# Patient Record
Sex: Female | Born: 1995
Health system: Southern US, Community
[De-identification: ages and names within clinical notes are randomized; demographics above are authoritative.]

---

## 2017-12-26 ENCOUNTER — Emergency Department (HOSPITAL_COMMUNITY): Payer: BLUE CROSS/BLUE SHIELD

## 2017-12-26 ENCOUNTER — Emergency Department (HOSPITAL_COMMUNITY)
Admission: EM | Admit: 2017-12-26 | Discharge: 2017-12-26 | Disposition: A | Discharge status: Home / Self Care | Payer: BLUE CROSS/BLUE SHIELD | Attending: Emergency Medicine | Admitting: Emergency Medicine

## 2017-12-26 ENCOUNTER — Other Ambulatory Visit: Payer: Self-pay

## 2017-12-26 DIAGNOSIS — S39012A Strain of muscle, fascia and tendon of lower back, initial encounter: Secondary | ICD-10-CM | POA: Diagnosis not present

## 2017-12-26 DIAGNOSIS — Y92481 Parking lot as the place of occurrence of the external cause: Secondary | ICD-10-CM | POA: Diagnosis not present

## 2017-12-26 DIAGNOSIS — S300XXA Contusion of lower back and pelvis, initial encounter: Secondary | ICD-10-CM | POA: Diagnosis not present

## 2017-12-26 DIAGNOSIS — Y999 Unspecified external cause status: Secondary | ICD-10-CM | POA: Insufficient documentation

## 2017-12-26 DIAGNOSIS — Y939 Activity, unspecified: Secondary | ICD-10-CM | POA: Insufficient documentation

## 2017-12-26 DIAGNOSIS — S3992XA Unspecified injury of lower back, initial encounter: Secondary | ICD-10-CM | POA: Diagnosis present

## 2017-12-26 DIAGNOSIS — S80212A Abrasion, left knee, initial encounter: Secondary | ICD-10-CM

## 2017-12-26 LAB — POC URINE PREG, ED: Preg Test, Ur: NEGATIVE

## 2017-12-26 NOTE — Discharge Instructions (Addendum)
Apply ice for thirty minutes, four times a day.  Take ibuprofen or naproxen as needed for pain.  Take acetaminophenIf pain for additional pain relief if pain is not being adequately controlled with ibuprofen or naproxen.
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
## 2017-12-31 ENCOUNTER — Emergency Department (HOSPITAL_BASED_OUTPATIENT_CLINIC_OR_DEPARTMENT_OTHER)
Admission: EM | Admit: 2017-12-31 | Discharge: 2017-12-31 | Disposition: A | Discharge status: Home / Self Care | Payer: BLUE CROSS/BLUE SHIELD | Attending: Emergency Medicine | Admitting: Emergency Medicine

## 2017-12-31 ENCOUNTER — Encounter (HOSPITAL_BASED_OUTPATIENT_CLINIC_OR_DEPARTMENT_OTHER): Payer: Self-pay

## 2017-12-31 ENCOUNTER — Other Ambulatory Visit: Payer: Self-pay

## 2017-12-31 DIAGNOSIS — S80912A Unspecified superficial injury of left knee, initial encounter: Secondary | ICD-10-CM | POA: Diagnosis present

## 2017-12-31 DIAGNOSIS — M545 Low back pain, unspecified: Secondary | ICD-10-CM

## 2017-12-31 DIAGNOSIS — M25552 Pain in left hip: Secondary | ICD-10-CM | POA: Diagnosis not present

## 2017-12-31 DIAGNOSIS — Y999 Unspecified external cause status: Secondary | ICD-10-CM | POA: Insufficient documentation

## 2017-12-31 DIAGNOSIS — Z79899 Other long term (current) drug therapy: Secondary | ICD-10-CM | POA: Insufficient documentation

## 2017-12-31 DIAGNOSIS — F17210 Nicotine dependence, cigarettes, uncomplicated: Secondary | ICD-10-CM | POA: Diagnosis not present

## 2017-12-31 DIAGNOSIS — S80212A Abrasion, left knee, initial encounter: Secondary | ICD-10-CM | POA: Diagnosis not present

## 2017-12-31 DIAGNOSIS — Y9301 Activity, walking, marching and hiking: Secondary | ICD-10-CM | POA: Diagnosis not present

## 2017-12-31 DIAGNOSIS — Y92481 Parking lot as the place of occurrence of the external cause: Secondary | ICD-10-CM | POA: Insufficient documentation

## 2017-12-31 DIAGNOSIS — M25562 Pain in left knee: Secondary | ICD-10-CM | POA: Diagnosis not present

## 2017-12-31 DIAGNOSIS — R600 Localized edema: Secondary | ICD-10-CM | POA: Diagnosis not present

## 2017-12-31 MED ORDER — METHOCARBAMOL 500 MG PO TABS: 500.0000 mg | ORAL_TABLET | Freq: Every evening | ORAL | 0 refills | Status: AC | PRN

## 2017-12-31 MED FILL — METHOCARBAMOL 500 MG TABLET: 500 | 10 days supply | Qty: 10 | Fill #0
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
## 2017-12-31 NOTE — Discharge Instructions (Signed)
Take ibuprofen 3 times a day with meals. Take 800 mg (4 pills) at a time. Do not take other anti-inflammatories at the same time open (Advil, Motrin, naproxen, Aleve). You may supplement with Tylenol 3 times a day if you need further pain control. Take 1000 mg at a time. Use robaxin as needed for muscle stiffness or soreness.  Have caution, this may make you tired or groggy.  Do not drive or operate heavy machinery while taking this medicine. Use ice packs or heating pads if this helps control your pain. You will likely have continued muscle stiffness and soreness over the next couple days.  Follow-up with orthopedics in 1 week if your symptoms are not improving. Return to the emergency room if you develop vision changes, vomiting, slurred speech, numbness, loss of bowel or bladder control, or any new or worsening symptoms.
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
